# Patient Record
Sex: Female | Born: 1980 | Race: Black or African American | Hispanic: No | Marital: Married
Health system: Southern US, Community
[De-identification: ages and names within clinical notes are randomized; demographics above are authoritative.]

---

## 2002-10-15 ENCOUNTER — Encounter: Admission: RE | Admit: 2002-10-15 | Discharge: 2002-11-14 | Payer: Self-pay | Admitting: Obstetrics and Gynecology

## 2008-04-26 ENCOUNTER — Ambulatory Visit: Payer: Self-pay | Admitting: Neurology

## 2008-04-28 ENCOUNTER — Ambulatory Visit: Payer: Self-pay | Admitting: Neurology

## 2012-11-04 ENCOUNTER — Observation Stay: Payer: Self-pay | Admitting: Obstetrics and Gynecology

## 2012-11-04 ENCOUNTER — Inpatient Hospital Stay: Payer: Self-pay | Admitting: Obstetrics and Gynecology

## 2012-11-04 LAB — CBC WITH DIFFERENTIAL/PLATELET
Lymphocyte #: 0.9 10*3/uL — ABNORMAL LOW (ref 1.0–3.6)
MCHC: 34.3 g/dL (ref 32.0–36.0)
MCV: 85 fL (ref 80–100)
Monocyte #: 0.8 x10 3/mm (ref 0.2–0.9)
Monocyte %: 5.9 %
Neutrophil #: 12.4 10*3/uL — ABNORMAL HIGH (ref 1.4–6.5)
Neutrophil %: 87 %
Platelet: 343 10*3/uL (ref 150–440)
RBC: 4.05 10*6/uL (ref 3.80–5.20)
RDW: 13.4 % (ref 11.5–14.5)
WBC: 14.2 10*3/uL — ABNORMAL HIGH (ref 3.6–11.0)

## 2013-08-25 ENCOUNTER — Ambulatory Visit: Payer: Self-pay | Admitting: Obstetrics and Gynecology

## 2013-08-25 LAB — CBC
HCT: 36.6 % (ref 35.0–47.0)
HGB: 11.9 g/dL — ABNORMAL LOW (ref 12.0–16.0)
MCH: 27.4 pg (ref 26.0–34.0)
MCHC: 32.5 g/dL (ref 32.0–36.0)
MCV: 84 fL (ref 80–100)
PLATELETS: 315 10*3/uL (ref 150–440)
RBC: 4.35 10*6/uL (ref 3.80–5.20)
RDW: 13.1 % (ref 11.5–14.5)
WBC: 10.5 10*3/uL (ref 3.6–11.0)

## 2013-08-25 LAB — URINALYSIS, COMPLETE
Bacteria: NONE SEEN
Bilirubin,UR: NEGATIVE
GLUCOSE, UR: NEGATIVE mg/dL (ref 0–75)
Ketone: NEGATIVE
LEUKOCYTE ESTERASE: NEGATIVE
Nitrite: NEGATIVE
PH: 7 (ref 4.5–8.0)
PROTEIN: NEGATIVE
RBC,UR: 2 /HPF (ref 0–5)
Specific Gravity: 1.011 (ref 1.003–1.030)
WBC UR: 3 /HPF (ref 0–5)

## 2013-08-25 LAB — COMPREHENSIVE METABOLIC PANEL
Albumin: 3.4 g/dL (ref 3.4–5.0)
Alkaline Phosphatase: 83 U/L
Anion Gap: 5 — ABNORMAL LOW (ref 7–16)
BILIRUBIN TOTAL: 0.3 mg/dL (ref 0.2–1.0)
BUN: 8 mg/dL (ref 7–18)
CALCIUM: 8.4 mg/dL — AB (ref 8.5–10.1)
Chloride: 106 mmol/L (ref 98–107)
Co2: 28 mmol/L (ref 21–32)
Creatinine: 0.97 mg/dL (ref 0.60–1.30)
EGFR (African American): >60
EGFR (Non-African Amer.): >60
GLUCOSE: 115 mg/dL — AB (ref 65–99)
Osmolality: 277 (ref 275–301)
POTASSIUM: 3.4 mmol/L — AB (ref 3.5–5.1)
SGOT(AST): 9 U/L — ABNORMAL LOW (ref 15–37)
SGPT (ALT): 15 U/L (ref 12–78)
Sodium: 139 mmol/L (ref 136–145)
TOTAL PROTEIN: 7.6 g/dL (ref 6.4–8.2)

## 2013-08-25 LAB — HCG, QUANTITATIVE, PREGNANCY: BETA HCG, QUANT.: 2447 m[IU]/mL — AB

## 2013-08-25 LAB — LIPASE, BLOOD: Lipase: 98 U/L (ref 73–393)

## 2013-08-27 LAB — PATHOLOGY REPORT

## 2014-09-12 NOTE — Op Note (Signed)
PATIENT NAME:  Wendy Newton, Daiana M MR#:  [id number] DATE OF BIRTH:  [birth year]  DATE OF PROCEDURE:  11/04/2012  PREOPERATIVE DIAGNOSES:  1.  Term gestation.  2.  Labor.  3.  Previous cesarean section. 4.  Planned repeat.   POSTOPERATIVE DIAGNOSES: 1.  Term gestation.  2.  Labor.  3.  Previous cesarean section. 4.  Planned repeat.   PROCEDURE: Repeat low transverse cesarean section.   SURGEON: Ricky L. Logan BoresEvans, M.D.   ASSISTANT: Ronnald CollumScrub, [place]).  ANESTHESIA: Thomas, spinal.   FINDINGS: A morbidly obese abdomen postsurgical, uterus and abdomen, grossly normal tubes and ovaries. Vigorous female infant, Apgars 9 and 9, weight 3150.   COMPLICATIONS: None.   DRAINS: Foley.  ANTIBIOTICS: Ancef 3 grams given IV preoperatively.   PROCEDURE IN DETAIL: The patient presented at 4 cm regular contractions. Planned repeat cesarean section this week again stated desire for repeat cesarean section, declined tubal. Consent was signed and antibiotics were given. The patient was taken to the operating room where spinal was placed. She was then placed in the supine position, prepped and draped in the usual sterile fashion. Foley had been inserted previously. After assuring adequate anesthesia, a #10 blade was used to create a Pfannenstiel incision through previous scar and repeat low transverse cesarean section was done in the usual fashion, chromic interlocking on the uterus. The pelvis was irrigated. The fascia was closed left to midline. On-Q drains were placed and tucked underneath the closed portion of fascia and fascial closure was completed. Subcutaneous tissues were hemostatic with cautery and the skin was closed with clips. The On-Q pump was placed. Catheters were primed with 5 mL each of 0.5 Sensorcaine. Dermabond, Steri-Strips and Tegaderm then placed in the usual fashion. A sterile dressing was applied. The patient tolerated the procedure well. I anticipate a routine postoperative course.    ____________________________ Reatha Harpsicky L. Logan BoresEvans, MD rle:aw D: 11/04/2012 16:42:27 ET T: 11/05/2012 07:20:17 ET JOB#: [id number]  cc: Clide Clifficky L. Logan BoresEvans, MD, <Dictator> Augustina MoodICK L Naysha Sholl MD ELECTRONICALLY SIGNED 11/05/2012 9:03

## 2014-09-13 NOTE — Op Note (Signed)
PATIENT NAME:  Wendy Newton, Wendy Newton MR#:  [id number] DATE OF BIRTH:  08/30/1980  DATE OF PROCEDURE:  08/25/2013  PREOPERATIVE DIAGNOSIS: Left ectopic pregnancy.   POSTOPERATIVE DIAGNOSIS: Left ectopic pregnancy.   OPERATION PERFORMED: Operative laparoscopy with left salpingectomy and lysis of adhesions.   ANESTHESIA USED: General.   PRIMARY SURGEON: Florina OuAndreas Newton. Bonney AidStaebler, MD  PREOPERATIVE ANTIBIOTICS: None.   ESTIMATED BLOOD LOSS: 25 mL.  INTRAVENOUS FLUIDS: 900 mL of crystalloid.   URINE OUTPUT: 50 mL of clear urine.   COMPLICATIONS: None.   FINDINGS: Left distal ectopic partially ruptured through the fimbriated portion of tube. No actual rupture visualized in the tubal lumen with significantly adhesive disease of the right uterus to the anterior abdominal wall as well as omentum to the umbilicus superior to be the site of the port entry.   SPECIMENS REMOVED: Left tube and ectopic pregnancy. The patient condition following the procedure stable.   PROCEDURE IN DETAIL: Risks, benefits, and alternatives to this procedure were discussed with the patient prior to proceeding to the operating room. The patient opted to proceed with operative management for the management of her ectopic. The patient was taken to the operating room where she was placed under general endotracheal anesthesia. She was positioned in the dorsal lithotomy position using Allen stirrups, prepped, and draped in the usual sterile fashion. Attention was turned to the patient's pelvis. The bladder was straight catheterized with a red rubber catheter and an operative speculum was then placed. Following placement of the operative speculum, the anterior lip of the cervix was grasped with a single-tooth tenaculum and a Hulka tenaculum was placed for uterine manipulation. The single-tooth tenaculum and speculum were then removed. Attention was turned to the patient's abdomen. The umbilicus was infiltrated with 1% plain lidocaine. A stab  incision was made to the base of the umbilicus and a 5-mm XL trocar was used to gain direct entry into the peritoneum using direct visualization via the XL trocar. Once entry into the peritoneum had been established, pneumoperitoneum established as well. A left 5-mm assistant port was placed followed by an 11-mm right assistant port. Both ports were placed under direct visualization. Once these ports were placed, the left assistant port was used to look at the adhesions to the anterior abdominal wall involving the omentum. These were out of the way of the umbilical port; however, they restrict movement of the right 11-mm assistant port and were therefore taken down using the 5-mm Harmonic device. The adhesion was noted to be free of bowel and the trocar was noted to have penetrated the abdominal wall inferior to the site of adhesions. Attention was then turned to the pelvis. There was significant adhesive disease of the right portion of the uterus to the pelvic sidewall and the anterior abdominal wall. The left fallopian tube had a large ectopic pregnancy which was partially expelled through the fimbriated portion of the fallopian tube. The expelled portion of the ectopic pregnancy was removed using a blunt grasper and placed in the anterior cul-de-sac for later removal. The tube was then freed off its attachments to the ovary and mesosalpinx using the 5-mm Harmonic device. Following removal of the tube, an Endo Catch bag was placed through the 11-mm port site and the specimen was removed. The pelvis was irrigated. The pedicle was inspected and noted to be hemostatic. The left ureter was inspected and noted to be well away from the site of the dissection and peristalsing normally. The pneumoperitoneum was then evacuated and all  port sites removed. The 11-mm port site was closed with a 0 Vicryl stitch for the fascia as well a 4-0 Monocryl subcuticular stitch. The 5-mm port sites were then dressed with Dermabond.  Sponge, needle, and instrument counts were correct x 2. The patient tolerated the procedure well and was taken to the recovery room in stable condition.    ____________________________ Florina Ou. Bonney Aid, MD ams:lt D: 08/26/2013 04:12:00 ET T: 08/26/2013 06:28:47 ET JOB#: [id number]  cc: Florina Ou. Bonney Aid, MD, <Dictator> [place] Cathrine Muster MD ELECTRONICALLY SIGNED 08/29/2013 9:25

## 2014-09-13 NOTE — Consult Note (Signed)
Consulting Department: Emergency Department Consulting Physician: Maretta Los MD Chief Complaint: Left sided abdominal pain, possible ectopic History of Present Illness: 34 March 7th 2015, menses usually regular monthly, 4-5 days in duration.  Pain started on 08/22/2013.  Started bleeding on 08/22/2013 light spotting.  Cramping LLQ pain, 10/10 at its worse currently a 6/10.   Has taken 1 ibuprofen in last 24-hrs.  Last ate last night at 7:30-8:00.  No N/V, fevers, chills, changes in micturition of bowl movements.  The patient is not currently on any contraceptives, has no risk factor for ectopic such as PID, prior adnexal surgery, or STD?s. Review of Systems: 10 point review of systems negative unless otherwise noted in HPI Past Medical History: none Past Surgical History: C-section Past Obstetric History: G3P4/29/2004 LTCS female, failure to progress 6lbs 15oz6/15/2014 RLTCS female, 6lbs 15oz Past Gynecologic History: History of prior abnormal paps.  Last obtain 2013.  No history STD.  Not currently on any contraception Family History: non-contributry Social History: No tobacco, EtOH, illicit drugs Allergies: NKDA Medications: none Physical Exam:97.16F; BP 145/87; HR 84; RR 20; O2sat 993% RANADnormocephalic anictericRRRCTABNABS, soft, mild tenderness to deep palpation LLQ, non-distended, no rebound or guardingdeferredno edema, no erythema, no tendernessA&O x 3, affect appropriate Labs:[id number] NA 139, K 3.4, Cl 106, CO2 28, BUN 8, Cr 0.97, AST 9, ALT 15, T. Bili 0.3, Alk phos 83WBC 10.5K, H&H 11.9 & 36.6, Platelets 315K Imaging Transvaginal Ultrasound 08/24/2013 Reviewed showing free fluid in the cul de sac no measurement, EMS appears thickened, no intrauterine pregnancy is visualized, findings consistent with left tubal ectopic with vascular flow  Assessment: 34 year old G8P2002 presenting with left ectopic pregnancy Plan: 1) Ectopic Pregnancy ? likely unruptured given hemodynamic stability  and H&H.  However has free fluid.  This could also represent a tubal abortion or be phyisiologic free fluid.  We discussed management option expectant, medical, and surgical with my opinion being that expectant really wasn?t an option given her ultrasound findings.  In addition with medical management I would admit the patient overnight to assure hemodynamic stability.  She is aware that with medical management there may be the need for retreatment, or surgical intervention in the future as it does not prevent tubal rupture necessarily.   Surgery carries with it risk of infection, bleeding, damage to internal organs, possible need for further surgery as well as the risk of anesthesia.  After discussing the management option with her husband the patient elects to proceed with surgical management via left salpingostomy vs salpingectomy with possible D&C if no tubal pregnancy is identified. - NPO - LR at 153m/hr - SCD?s - No preoperative antibiotics required - Chlorhexadine abdominal prep, betadine vaginal prep - B positive per last delivery records rhogam not indicated     Electronic Signatures: SDorthula Nettles(MD)  (Signed on 05-Apr-15 11:37)  Authored  Last Updated: 05-Apr-15 11:37 by SDorthula Nettles(MD)

## 2014-09-30 NOTE — H&P (Signed)
L&D Evaluation:  [facility]:  [facility]   Presents with contractions   Patient's Medical History morbid obesity   Patient's Surgical History Previous C-Section   Medications Pre Natal Vitamins   Allergies NKDA   Social History none   Family History Non-Contributory   ROS:  ROS All systems were reviewed.  HEENT, CNS, GI, GU, Respiratory, CV, Renal and Musculoskeletal systems were found to be normal.   Exam:  Vital Signs stable   Chest clear   Heart normal sinus rhythm   Abdomen gravid, tender with contractions   Estimated Fetal Weight Average for gestational age   Pelvic 4cm   FHT normal rate with no decels   Ucx regular   Ucx Frequency 4 min   Impression:  Impression early labor, planned repeat C/S   Plan:  Comments Repeat C/S Declines BTL + GBS urine this pregnancy   Electronic Signatures: Margaretha GlassingEvans, Ricky L (MD)  (Signed 15-Jun-14 14:51)  Authored: L&D Evaluation   Last Updated: 15-Jun-14 14:51 by Margaretha GlassingEvans, Ricky L (MD)

## 2015-08-25 IMAGING — US US OB < 14 WEEKS - US OB TV
1 series · 14 of 28 positions shown · non-contrast
Comparison: None.

CLINICAL DATA: Abdominal pain, quantitative beta HCG 2,477

EXAM:
OBSTETRIC <14 WK US AND TRANSVAGINAL OB US
TECHNIQUE: Both transabdominal and transvaginal ultrasound examinations were
performed for complete evaluation of the gestation as well as the
maternal uterus, adnexal regions, and pelvic cul-de-sac.
Transvaginal technique was performed to assess early pregnancy.

[Series 1: us ob < 14 weeks - us ob tv · 0.23mm/px · 91 acquisitions, 14 frames shown]
[im 4/91]
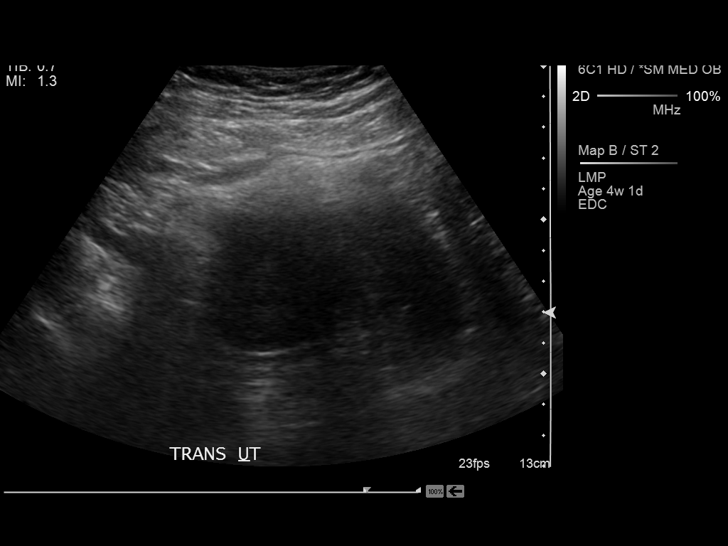
[im 11/91]
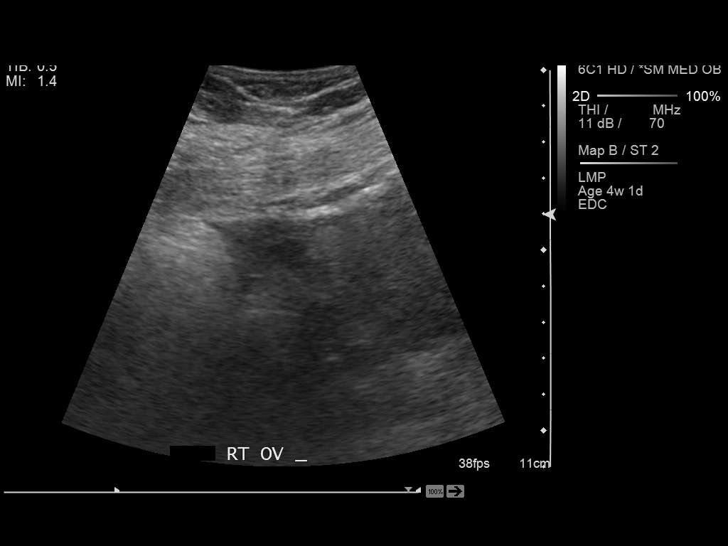
[im 17/91]
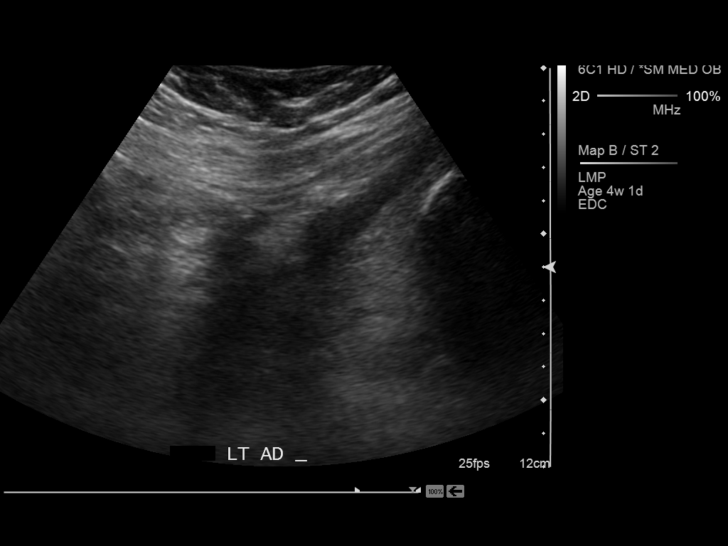
[im 24/91]
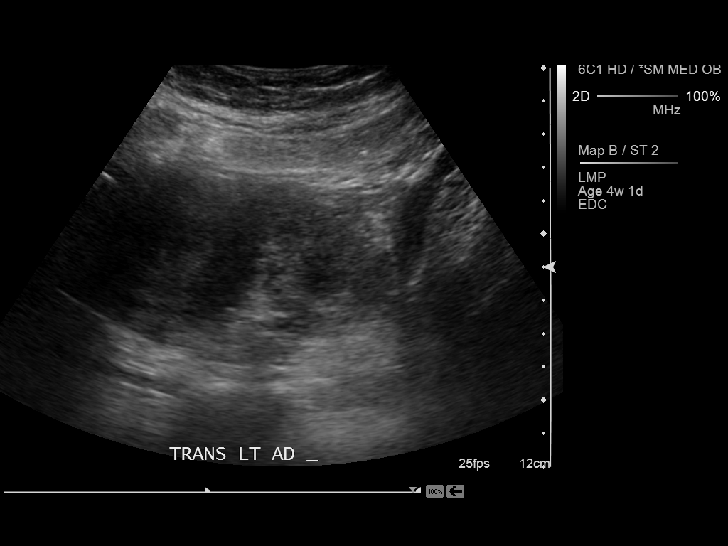
[im 31/91]
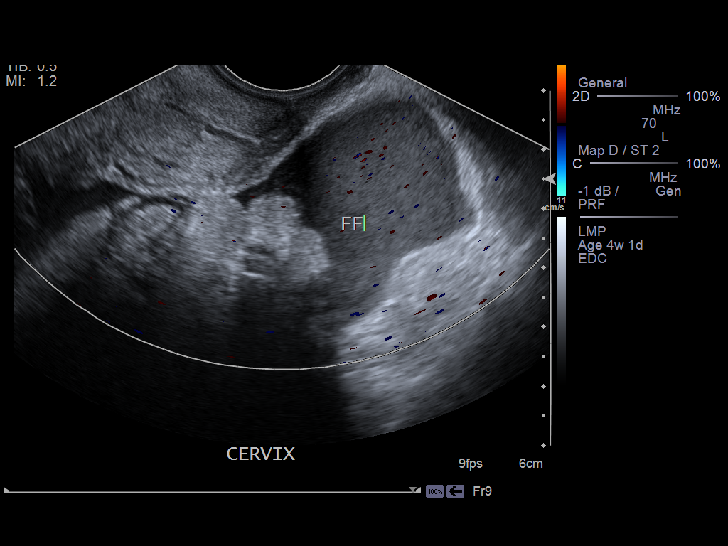
[im 37/91]
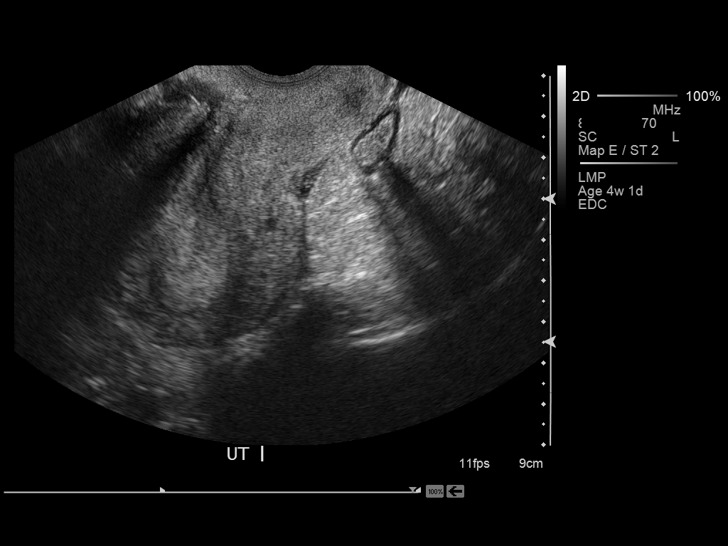
[im 44/91]
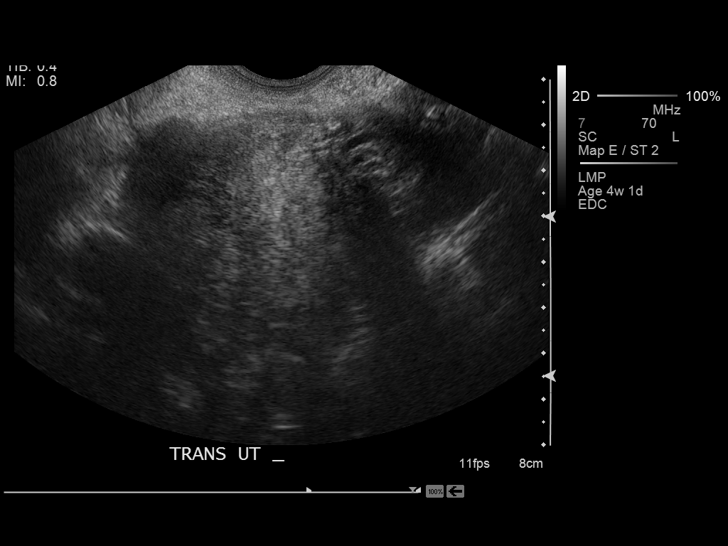
[im 51/91]
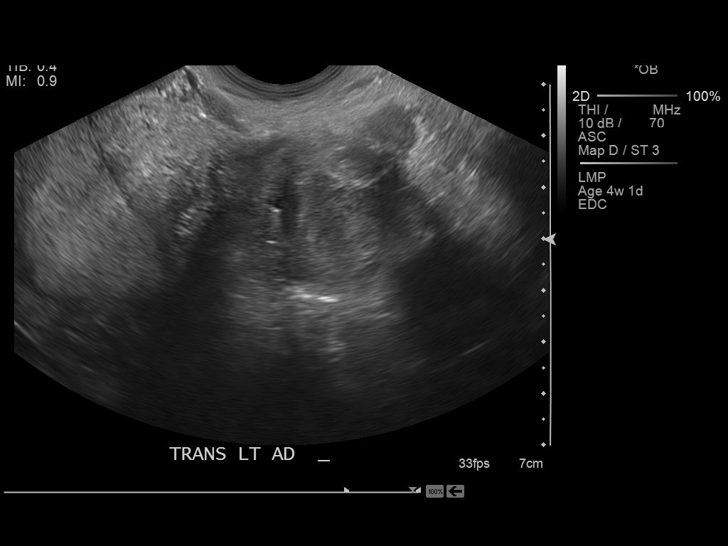
[im 57/91]
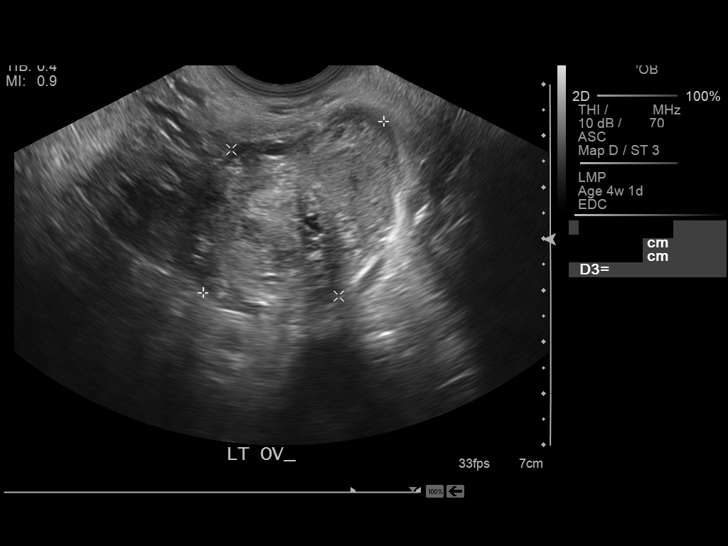
[im 64/91]
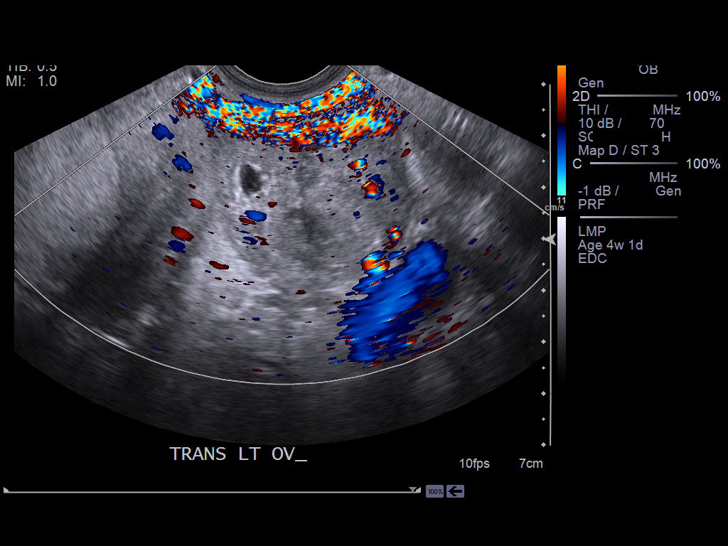
[im 71/91]
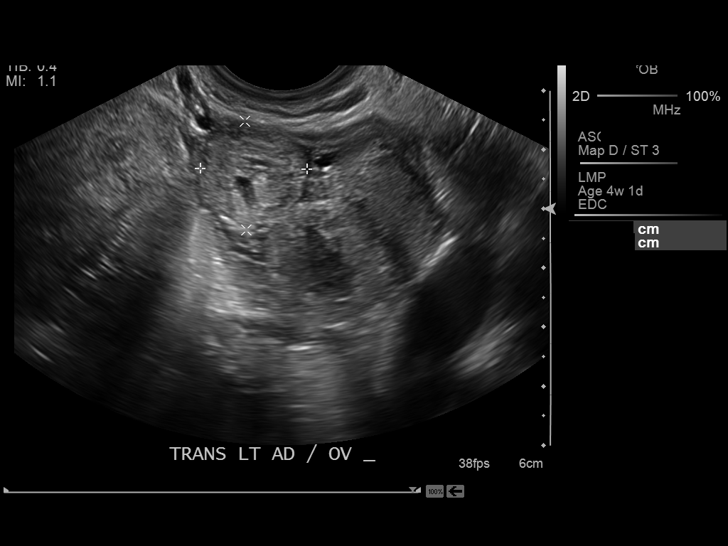
[im 77/91]
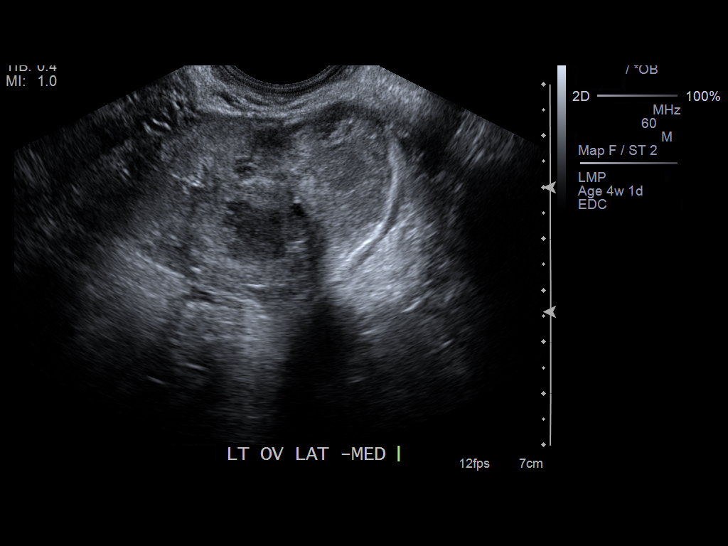
[im 84/91]
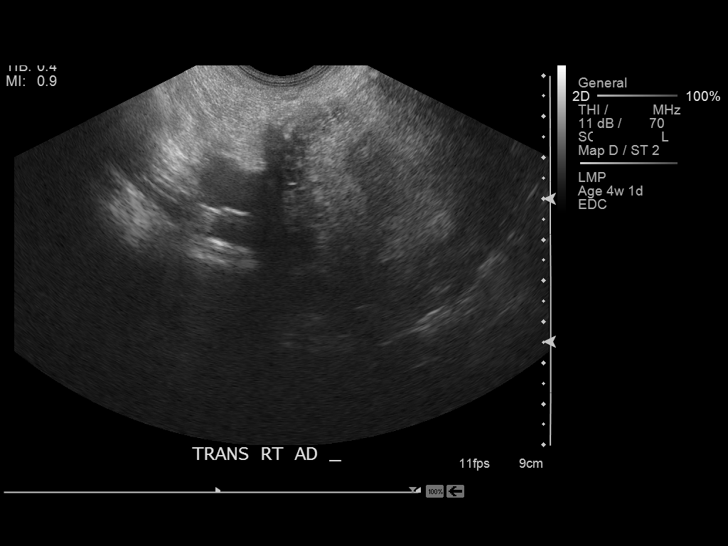
[im 91/91]
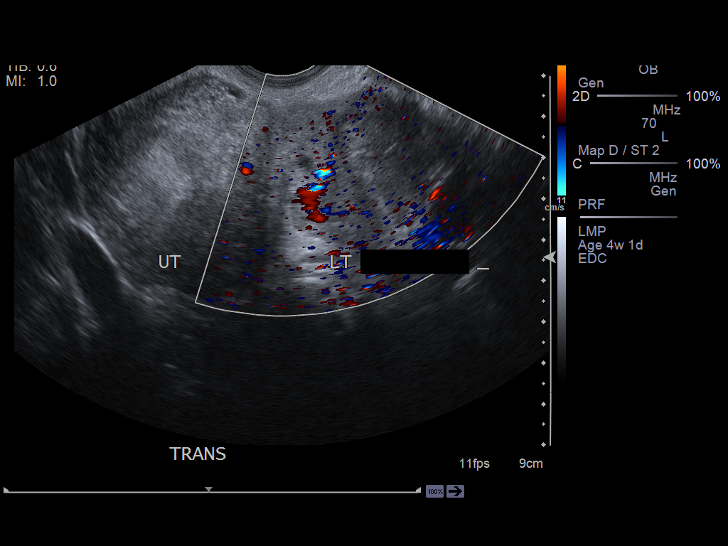

[14 of 28 positions shown; findings below may reference images not displayed]

FINDINGS: Intrauterine gestational sac: Visualized/normal in shape.

Yolk sac:  Not visualized

Embryo:  The visualized

Cardiac Activity: Not present

Heart Rate:  Not applicable bpm

Maternal uterus/adnexae: A 1.8 x 1.8 x 2.4 cm hypoechoic mass with
an anechoic center is appreciated in the left adnexa adjacent to the
ovary, and demonstrating peripheral vascularity.

Moderate amount of complex fluid within the pelvis.
IMPRESSION: Findings consistent with an ectopic pregnancy . Ob consultation
recommended. These results were called by telephone at the time of
interpretation on 08/25/2013 at [DATE] to Dr. SOUADE BAB , who
verbally acknowledged these results.

No evidence of an intrauterine gestational sac
